# Patient Record
Sex: Female | Born: 1975 | Hispanic: Yes | Marital: Married | Smoking: Never smoker
Health system: Southern US, Community
[De-identification: ages and names within clinical notes are randomized; demographics above are authoritative.]

## PROBLEM LIST (undated history)

## (undated) DIAGNOSIS — L509 Urticaria, unspecified: Secondary | ICD-10-CM

## (undated) HISTORY — DX: Urticaria, unspecified: L50.9

---

## 2016-10-25 ENCOUNTER — Other Ambulatory Visit: Payer: Self-pay | Admitting: Nurse Practitioner

## 2016-10-25 DIAGNOSIS — Z1231 Encounter for screening mammogram for malignant neoplasm of breast: Secondary | ICD-10-CM

## 2016-11-09 ENCOUNTER — Ambulatory Visit
Admission: RE | Admit: 2016-11-09 | Discharge: 2016-11-09 | Disposition: A | Discharge status: Home / Self Care | Payer: No Typology Code available for payment source | Source: Ambulatory Visit | Attending: Nurse Practitioner | Admitting: Nurse Practitioner

## 2016-11-09 DIAGNOSIS — Z1231 Encounter for screening mammogram for malignant neoplasm of breast: Secondary | ICD-10-CM

## 2016-11-10 ENCOUNTER — Other Ambulatory Visit: Payer: Self-pay | Admitting: Nurse Practitioner

## 2016-11-10 DIAGNOSIS — R928 Other abnormal and inconclusive findings on diagnostic imaging of breast: Secondary | ICD-10-CM

## 2016-11-16 ENCOUNTER — Other Ambulatory Visit: Payer: Self-pay | Admitting: Obstetrics and Gynecology

## 2016-11-16 DIAGNOSIS — R928 Other abnormal and inconclusive findings on diagnostic imaging of breast: Secondary | ICD-10-CM

## 2016-12-08 ENCOUNTER — Ambulatory Visit
Admission: RE | Admit: 2016-12-08 | Discharge: 2016-12-08 | Disposition: A | Discharge status: Home / Self Care | Payer: No Typology Code available for payment source | Source: Ambulatory Visit | Attending: Obstetrics and Gynecology | Admitting: Obstetrics and Gynecology

## 2016-12-08 ENCOUNTER — Encounter (HOSPITAL_COMMUNITY): Payer: Self-pay

## 2016-12-08 ENCOUNTER — Ambulatory Visit (HOSPITAL_COMMUNITY)
Admission: RE | Admit: 2016-12-08 | Discharge: 2016-12-08 | Disposition: A | Discharge status: Home / Self Care | Payer: Self-pay | Source: Ambulatory Visit | Attending: Obstetrics and Gynecology | Admitting: Obstetrics and Gynecology

## 2016-12-08 VITALS — BP 135/81 | HR 63 | Temp 98.2°F | Ht 62.0 in | Wt 181.5 lb

## 2016-12-08 DIAGNOSIS — Z1239 Encounter for other screening for malignant neoplasm of breast: Secondary | ICD-10-CM

## 2016-12-08 DIAGNOSIS — R928 Other abnormal and inconclusive findings on diagnostic imaging of breast: Secondary | ICD-10-CM

## 2016-12-08 NOTE — Progress Notes (Signed)
[facility] due to recommending additional imaging of the left breast. Screening mammogram completed 11/09/2016.  Pap Smear: Pap smear not completed today. [facility] and normal per patient. Per patient has no history of an abnormal Pap smear. No Pap smear results are in EPIC.  Physical exam: Breasts Breasts symmetrical. No skin abnormalities bilateral breasts. No nipple retraction bilateral breasts. No nipple discharge bilateral breasts. No lymphadenopathy. No lumps palpated bilateral breasts. No complaints of pain or tenderness on exam. [facility] for a left breast diagnostic mammogram and breast ultrasound. Appointment scheduled for Thursday, December 08, 2016 at 1120.        Pelvic/Bimanuala  No Pap smear completed today since last Pap smear was 05/18/2016 per patient. Pap smear not indicated per BCCCP guidelines.   Smoking History: Patient has never smoked.  Patient Navigation: Patient education provided. [facility].

## 2016-12-08 NOTE — Patient Instructions (Signed)
Explained breast self awareness with Jasmine Burch. Patient did not need a Pap smear today due to last Pap smear was 05/18/2016 per patient. Let her know [facility]CP will cover Pap smears every 3 years unless has a history of abnormal Pap smears. [facility] for a left breast diagnostic mammogram and breast ultrasound. Appointment scheduled for Thursday, December 08, 2016 at 1120. Jasmine LatusMaria Mendez - Burch verbalized understanding.  Marlaine Arey, Kathaleen Maserhristine Poll, RN 1:44 PM

## 2017-02-08 ENCOUNTER — Encounter (HOSPITAL_COMMUNITY): Payer: Self-pay | Admitting: *Deleted

## 2017-02-08 ENCOUNTER — Emergency Department (HOSPITAL_COMMUNITY)
Admission: EM | Admit: 2017-02-08 | Discharge: 2017-02-09 | Disposition: A | Discharge status: Home / Self Care | Payer: Self-pay | Attending: Emergency Medicine | Admitting: Emergency Medicine

## 2017-02-08 ENCOUNTER — Emergency Department (HOSPITAL_COMMUNITY): Payer: Self-pay

## 2017-02-08 DIAGNOSIS — M25422 Effusion, left elbow: Secondary | ICD-10-CM | POA: Insufficient documentation

## 2017-02-08 LAB — CBC WITH DIFFERENTIAL/PLATELET
BASOS ABS: 0 10*3/uL (ref 0.0–0.1)
BASOS PCT: 0 %
EOS ABS: 0.1 10*3/uL (ref 0.0–0.7)
Eosinophils Relative: 1 %
HCT: 40.4 % (ref 36.0–46.0)
HEMOGLOBIN: 13.4 g/dL (ref 12.0–15.0)
Lymphocytes Relative: 30 %
Lymphs Abs: 2.7 10*3/uL (ref 0.7–4.0)
MCH: 30.3 pg (ref 26.0–34.0)
MCHC: 33.2 g/dL (ref 30.0–36.0)
MCV: 91.4 fL (ref 78.0–100.0)
Monocytes Absolute: 0.7 10*3/uL (ref 0.1–1.0)
Monocytes Relative: 7 %
NEUTROS PCT: 62 %
Neutro Abs: 5.6 10*3/uL (ref 1.7–7.7)
Platelets: 247 10*3/uL (ref 150–400)
RBC: 4.42 MIL/uL (ref 3.87–5.11)
RDW: 12.9 % (ref 11.5–15.5)
WBC: 9 10*3/uL (ref 4.0–10.5)

## 2017-02-08 LAB — COMPREHENSIVE METABOLIC PANEL
ALBUMIN: 4 g/dL (ref 3.5–5.0)
ALK PHOS: 60 U/L (ref 38–126)
ALT: 24 U/L (ref 14–54)
ANION GAP: 7 (ref 5–15)
AST: 19 U/L (ref 15–41)
BUN: 9 mg/dL (ref 6–20)
CALCIUM: 9.3 mg/dL (ref 8.9–10.3)
CO2: 25 mmol/L (ref 22–32)
CREATININE: 0.54 mg/dL (ref 0.44–1.00)
Chloride: 104 mmol/L (ref 101–111)
GFR calc Af Amer: >60 mL/min (ref >60)
GFR calc non Af Amer: >60 mL/min (ref >60)
GLUCOSE: 76 mg/dL (ref 65–99)
Potassium: 3.6 mmol/L (ref 3.5–5.1)
SODIUM: 136 mmol/L (ref 135–145)
Total Bilirubin: 0.5 mg/dL (ref 0.3–1.2)
Total Protein: 7.4 g/dL (ref 6.5–8.1)

## 2017-02-08 MED ORDER — VANCOMYCIN HCL IN DEXTROSE 1-5 GM/200ML-% IV SOLN
1000.0000 mg | Freq: Once | INTRAVENOUS | Status: AC
  Administered 2017-02-08: 1000 mg via INTRAVENOUS
  Filled 2017-02-08: qty 200

## 2017-02-08 MED ORDER — HYDROCODONE-ACETAMINOPHEN 5-325 MG PO TABS
2.0000 | ORAL_TABLET | Freq: Once | ORAL | Status: AC
  Administered 2017-02-08: 2 via ORAL
  Filled 2017-02-08: qty 2

## 2017-02-08 NOTE — ED Notes (Signed)
Pt presents to the ED after striking her elbow on a fence gate. Pt went to UC and was referred here to the ED for possible joint infection or blood clot.

## 2017-02-08 NOTE — ED Triage Notes (Signed)
The pt is c/o lt elbow pain for 2 days.  She struck her lt elbow on a gate but no cut was seen  Today she hsw her doctor who did a xray  No fracture was seen but there was a question of cellulitis joint infection or blood clot  The pain has been worse today  With painful movement  lmp last month she has bc

## 2017-02-08 NOTE — ED Provider Notes (Signed)
[facility] Provider Note   CSN: [id number] Arrival date & time: 02/08/17  1618     History   Chief Complaint Chief Complaint  Patient presents with  . Joint Swelling    HPI Latanya PresserMaria Mendez-Damian is a 41 y.o. female.  The history is provided by the patient.  Arm Injury   This is a new problem. The current episode started 2 days ago. The problem occurs constantly. The problem has been gradually worsening. The pain is present in the left elbow. The pain is severe. Associated symptoms include limited range of motion and stiffness. The symptoms are aggravated by activity. She has tried rest for the symptoms. The treatment provided no relief.  pt reports about 2 days ago she hit her left elbow on a gate but denies any falls/trauma or significant injury Today she reports pain/swelling in elbow Seen at urgent care, had xray of left elbow and sent for evaluation for concern for DVT or cellulitis No other acute complaints   PMH - none Soc hx - denies drug use OB History    Gravida Para Term Preterm AB Living   5 4     1 4    SAB TAB Ectopic Multiple Live Births     1     4       Home Medications    Prior to Admission medications   Not on File    Family History No family history on file.  Social History Social History  Substance Use Topics  . Smoking status: Never Smoker  . Smokeless tobacco: Never Used  . Alcohol use No     Allergies   Patient has no known allergies.   Review of Systems Review of Systems  Constitutional: Negative for fever.  Respiratory: Negative for shortness of breath.   Cardiovascular: Negative for chest pain.  Gastrointestinal: Negative for vomiting.  Musculoskeletal: Positive for joint swelling and stiffness.  All other systems reviewed and are negative.    Physical Exam Updated Vital Signs BP 137/87 (BP Location: Right Arm)   Pulse 80   Temp 99.1 F (37.3 C)   Resp 12   Ht 1.575 m (5\' 2" )    Wt 79.4 kg (175 lb)   LMP 01/08/2017   SpO2 100%   BMI 32.01 kg/m   Physical Exam  CONSTITUTIONAL: Well developed/well nourished, anxious HEAD: Normocephalic/atraumatic EYES: EOMI/PERRL ENMT: Mucous membranes moist NECK: supple no meningeal signs SPINE/BACK:entire spine nontender CV: S1/S2 noted, no murmurs/rubs/gallops noted LUNGS: Lungs are clear to auscultation bilaterally, no apparent distress ABDOMEN: soft, nontender, no rebound or guarding, bowel sounds noted throughout abdomen GU:no cva tenderness NEURO: Pt is awake/alert/appropriate, moves all extremitiesx4.  No facial droop.   EXTREMITIES: pulses normal/equal, tenderness/warmth to left elbow.  No crepitus.  No lacerations.  She has limitation in ROM of left elbow.  Tenderness throughout left humerus as well as left forearm.  Distal pulses intact.  She is able to moves fingers/left wrist.   SKIN: warm, color normal PSYCH: no abnormalities of mood noted, alert and oriented to situation     Patient gave verbal permission to utilize photo for medical documentation only The image was not stored on any personal device  ED Treatments / Results  Labs (all labs ordered are listed, but only abnormal results are displayed) Labs Reviewed  CBC WITH DIFFERENTIAL/PLATELET  COMPREHENSIVE METABOLIC PANEL    EKG  EKG Interpretation None       Radiology Dg Elbow Complete Left  Result Date: 02/09/2017 CLINICAL DATA:  Left elbow pain for 2 days. Swelling. Patient reports recent evaluation at urgent care for same. EXAM: LEFT ELBOW - COMPLETE 3+ VIEW COMPARISON:  None available. FINDINGS: There is no evidence of fracture or dislocation. Possible but not definitive joint effusion. There is no evidence of arthropathy or other focal bone abnormality. Soft tissue edema noted posteriorly. No soft tissue air or radiopaque foreign body. IMPRESSION: Possible joint effusion without osseous abnormality. Mild soft tissue edema.  Electronically Signed   By: Rubye Oaks M.D.   On: 02/09/2017 00:20    Procedures .Joint Aspiration/Arthrocentesis Date/Time: 02/09/2017 2:13 AM Performed by: Zadie Rhine Authorized by: Zadie Rhine   Consent:    Consent obtained:  Written   Consent given by:  Patient   Risks discussed:  Bleeding, infection and pain   Alternatives discussed:  No treatment Location:    Location:  Elbow   Elbow:  L elbow Anesthesia (see MAR for exact dosages):    Anesthesia method:  Local infiltration   Local anesthetic:  Lidocaine 1% w/o epi Procedure details:    Preparation: Patient was prepped and draped in usual sterile fashion     Needle gauge:  18 G   Ultrasound guidance: no     Aspirate amount:  Minimal   Aspirate characteristics:  Blood-tinged   Specimen collected: no   Post-procedure details:    Dressing:  Adhesive bandage   Patient tolerance of procedure:  Tolerated well, no immediate complications    Medications Ordered in ED Medications  HYDROcodone-acetaminophen (NORCO/VICODIN) 5-325 MG per tablet 2 tablet (2 tablets Oral Given 02/08/17 2337)  vancomycin (VANCOCIN) IVPB 1000 mg/200 mL premix (0 mg Intravenous Stopped 02/09/17 0053)  ibuprofen (ADVIL,MOTRIN) tablet 800 mg (800 mg Oral Given 02/09/17 0158)  fentaNYL (SUBLIMAZE) injection 50 mcg (50 mcg Intravenous Given 02/09/17 0158)  lidocaine (PF) (XYLOCAINE) 1 % injection 5 mL (5 mLs Other Given 02/09/17 0157)     Initial Impression / Assessment and Plan / ED Course  I have reviewed the triage vital signs and the nursing notes.  Pertinent labs & imaging results that were available during my care of the patient were reviewed by me and considered in my medical decision making (see chart for details). Narcotic database reviewed and considered in decision making     11:42 PM Will get repeat xray, suspect soft tissue infection, low suspicion for DVT 1:42 AM Repeat exam reveals likely joint effusion Will perform  arthrocentesis Discussed with dr swinteck earlier to arrange followup 2:14 AM Unable to extract any significant amt of synovial fluid Pt tolerated well No distress Will place in sling, start Ibuprofen/doxycycline/vicodin with close ortho f/u.  If can't get into ortho will need to be seen in the ED 3:47 AM Pt stable She appears improved Gout vs. Cellulitis vs septic joint, though I feel septic joint unlikely given low risk status She is well appearing Discussed strict ER return precautions and also ortho followup Sling given for comfort BP 132/74   Pulse 78   Temp 99.1 F (37.3 C)   Resp 18   Ht 1.575 m (5\' 2" )   Wt 79.4 kg (175 lb)   LMP 01/08/2017   SpO2 100%   BMI 32.01 kg/m   Final Clinical Impressions(s) / ED Diagnoses   Final diagnoses:  Effusion of left elbow    New Prescriptions New Prescriptions   DOXYCYCLINE (VIBRAMYCIN) 100 MG CAPSULE    Take 1 capsule (100 mg total) by mouth 2 (  two) times daily. One po bid x 7 days   HYDROCODONE-ACETAMINOPHEN (NORCO/VICODIN) 5-325 MG TABLET    Take 1 tablet by mouth every 6 (six) hours as needed for severe pain.   IBUPROFEN (ADVIL,MOTRIN) 600 MG TABLET    Take 1 tablet (600 mg total) by mouth every 8 (eight) hours as needed for moderate pain.     Zadie Rhine, MD 02/09/17 [phone]

## 2017-02-09 MED ORDER — IBUPROFEN 800 MG PO TABS
800.0000 mg | ORAL_TABLET | Freq: Once | ORAL | Status: AC
  Administered 2017-02-09: 800 mg via ORAL
  Filled 2017-02-09: qty 1

## 2017-02-09 MED ORDER — HYDROCODONE-ACETAMINOPHEN 5-325 MG PO TABS: 1.0000 | ORAL_TABLET | Freq: Four times a day (QID) | ORAL | 0 refills | Status: DC | PRN

## 2017-02-09 MED ORDER — IBUPROFEN 600 MG PO TABS: 600.0000 mg | ORAL_TABLET | Freq: Three times a day (TID) | ORAL | 0 refills | Status: AC | PRN

## 2017-02-09 MED ORDER — DOXYCYCLINE HYCLATE 100 MG PO CAPS: 100.0000 mg | ORAL_CAPSULE | Freq: Two times a day (BID) | ORAL | 0 refills | Status: DC

## 2017-02-09 MED ORDER — LIDOCAINE HCL (PF) 1 % IJ SOLN
5.0000 mL | Freq: Once | INTRAMUSCULAR | Status: AC
  Administered 2017-02-09: 5 mL
  Filled 2017-02-09: qty 5

## 2017-02-09 MED ORDER — FENTANYL CITRATE (PF) 100 MCG/2ML IJ SOLN
50.0000 ug | Freq: Once | INTRAMUSCULAR | Status: AC
  Administered 2017-02-09: 50 ug via INTRAVENOUS
  Filled 2017-02-09: qty 2

## 2017-02-09 NOTE — Discharge Instructions (Signed)
IF  YOU ARE UNABLE TO BE SEEN BY ORTHOPEDIC DOCTOR, PLEASE RETURN TO THE ER FOR RECHECK RETURN TO ER EARLIER FOR FEVER OVER A 101, WORSENED PAIN/SWELLING IN THE LEFT ARM OVER THE NEXT 24-48 HOURS

## 2017-11-09 ENCOUNTER — Telehealth (HOSPITAL_COMMUNITY): Payer: Self-pay | Admitting: *Deleted

## 2017-11-09 NOTE — Telephone Encounter (Signed)
Telephoned patient at home number and patient is not eligible for [facility] due to income. Patient voiced understanding.

## 2017-11-13 ENCOUNTER — Other Ambulatory Visit: Payer: Self-pay | Admitting: Nurse Practitioner

## 2017-11-13 ENCOUNTER — Other Ambulatory Visit: Payer: Self-pay | Admitting: Family Medicine

## 2017-11-13 DIAGNOSIS — Z1231 Encounter for screening mammogram for malignant neoplasm of breast: Secondary | ICD-10-CM

## 2017-11-13 DIAGNOSIS — Z30431 Encounter for routine checking of intrauterine contraceptive device: Secondary | ICD-10-CM

## 2017-11-16 ENCOUNTER — Ambulatory Visit
Admission: RE | Admit: 2017-11-16 | Discharge: 2017-11-16 | Disposition: A | Discharge status: Home / Self Care | Payer: Self-pay | Source: Ambulatory Visit | Attending: Nurse Practitioner | Admitting: Nurse Practitioner

## 2017-11-16 DIAGNOSIS — Z30431 Encounter for routine checking of intrauterine contraceptive device: Secondary | ICD-10-CM

## 2017-12-07 ENCOUNTER — Ambulatory Visit
Admission: RE | Admit: 2017-12-07 | Discharge: 2017-12-07 | Disposition: A | Discharge status: Home / Self Care | Payer: No Typology Code available for payment source | Source: Ambulatory Visit | Attending: Nurse Practitioner | Admitting: Nurse Practitioner

## 2017-12-07 DIAGNOSIS — Z1231 Encounter for screening mammogram for malignant neoplasm of breast: Secondary | ICD-10-CM

## 2019-04-12 HISTORY — PX: LIPOSUCTION: SHX10

## 2019-06-18 ENCOUNTER — Other Ambulatory Visit (HOSPITAL_COMMUNITY): Payer: Self-pay | Admitting: Nurse Practitioner

## 2019-06-18 DIAGNOSIS — Z1231 Encounter for screening mammogram for malignant neoplasm of breast: Secondary | ICD-10-CM

## 2019-07-03 ENCOUNTER — Other Ambulatory Visit: Payer: Self-pay

## 2019-07-03 ENCOUNTER — Ambulatory Visit (HOSPITAL_COMMUNITY)
Admission: RE | Admit: 2019-07-03 | Discharge: 2019-07-03 | Disposition: A | Discharge status: Home / Self Care | Payer: Self-pay | Source: Ambulatory Visit | Attending: Nurse Practitioner | Admitting: Nurse Practitioner

## 2019-07-03 DIAGNOSIS — Z1231 Encounter for screening mammogram for malignant neoplasm of breast: Secondary | ICD-10-CM | POA: Insufficient documentation

## 2019-07-05 ENCOUNTER — Other Ambulatory Visit: Payer: Self-pay

## 2019-07-05 ENCOUNTER — Emergency Department (HOSPITAL_BASED_OUTPATIENT_CLINIC_OR_DEPARTMENT_OTHER)
Admission: EM | Admit: 2019-07-05 | Discharge: 2019-07-05 | Disposition: A | Discharge status: Home / Self Care | Payer: No Typology Code available for payment source | Attending: Emergency Medicine | Admitting: Emergency Medicine

## 2019-07-05 ENCOUNTER — Emergency Department (HOSPITAL_BASED_OUTPATIENT_CLINIC_OR_DEPARTMENT_OTHER): Payer: No Typology Code available for payment source

## 2019-07-05 ENCOUNTER — Encounter (HOSPITAL_BASED_OUTPATIENT_CLINIC_OR_DEPARTMENT_OTHER): Payer: Self-pay

## 2019-07-05 DIAGNOSIS — R1032 Left lower quadrant pain: Secondary | ICD-10-CM

## 2019-07-05 DIAGNOSIS — N10 Acute tubulo-interstitial nephritis: Secondary | ICD-10-CM | POA: Insufficient documentation

## 2019-07-05 DIAGNOSIS — N12 Tubulo-interstitial nephritis, not specified as acute or chronic: Secondary | ICD-10-CM

## 2019-07-05 LAB — URINALYSIS, ROUTINE W REFLEX MICROSCOPIC
Bilirubin Urine: NEGATIVE
Glucose, UA: NEGATIVE mg/dL
Ketones, ur: NEGATIVE mg/dL
Leukocytes,Ua: NEGATIVE
Nitrite: NEGATIVE
Protein, ur: NEGATIVE mg/dL
Specific Gravity, Urine: 1.025 (ref 1.005–1.030)
pH: 6 (ref 5.0–8.0)

## 2019-07-05 LAB — CBC WITH DIFFERENTIAL/PLATELET
Abs Immature Granulocytes: 0.01 10*3/uL (ref 0.00–0.07)
Basophils Absolute: 0 10*3/uL (ref 0.0–0.1)
Basophils Relative: 0 %
Eosinophils Absolute: 0.1 10*3/uL (ref 0.0–0.5)
Eosinophils Relative: 1 %
HCT: 43.3 % (ref 36.0–46.0)
Hemoglobin: 13.8 g/dL (ref 12.0–15.0)
Immature Granulocytes: 0 %
Lymphocytes Relative: 37 %
Lymphs Abs: 2.8 10*3/uL (ref 0.7–4.0)
MCH: 29.2 pg (ref 26.0–34.0)
MCHC: 31.9 g/dL (ref 30.0–36.0)
MCV: 91.5 fL (ref 80.0–100.0)
Monocytes Absolute: 0.4 10*3/uL (ref 0.1–1.0)
Monocytes Relative: 5 %
Neutro Abs: 4.3 10*3/uL (ref 1.7–7.7)
Neutrophils Relative %: 57 %
Platelets: 316 10*3/uL (ref 150–400)
RBC: 4.73 MIL/uL (ref 3.87–5.11)
RDW: 12.6 % (ref 11.5–15.5)
WBC: 7.6 10*3/uL (ref 4.0–10.5)
nRBC: 0 % (ref 0.0–0.2)

## 2019-07-05 LAB — BASIC METABOLIC PANEL
Anion gap: 10 (ref 5–15)
BUN: 8 mg/dL (ref 6–20)
CO2: 23 mmol/L (ref 22–32)
Calcium: 9 mg/dL (ref 8.9–10.3)
Chloride: 103 mmol/L (ref 98–111)
Creatinine, Ser: 0.55 mg/dL (ref 0.44–1.00)
GFR calc Af Amer: >60 mL/min (ref >60)
GFR calc non Af Amer: >60 mL/min (ref >60)
Glucose, Bld: 84 mg/dL (ref 70–99)
Potassium: 3.6 mmol/L (ref 3.5–5.1)
Sodium: 136 mmol/L (ref 135–145)

## 2019-07-05 LAB — PREGNANCY, URINE: Preg Test, Ur: NEGATIVE

## 2019-07-05 LAB — URINALYSIS, MICROSCOPIC (REFLEX)

## 2019-07-05 MED ORDER — SODIUM CHLORIDE 0.9 % IV BOLUS
1000.0000 mL | Freq: Once | INTRAVENOUS | Status: AC
  Administered 2019-07-05: 1000 mL via INTRAVENOUS

## 2019-07-05 MED ORDER — SODIUM CHLORIDE 0.9 % IV SOLN
1.0000 g | Freq: Once | INTRAVENOUS | Status: AC
  Administered 2019-07-05 (×2): 1 g via INTRAVENOUS
  Filled 2019-07-05: qty 10

## 2019-07-05 MED ORDER — ONDANSETRON HCL 4 MG/2ML IJ SOLN
4.0000 mg | Freq: Once | INTRAMUSCULAR | Status: AC
  Administered 2019-07-05: 4 mg via INTRAVENOUS
  Filled 2019-07-05: qty 2

## 2019-07-05 MED ORDER — HYDROCODONE-ACETAMINOPHEN 5-325 MG PO TABS: 1.0000 | ORAL_TABLET | ORAL | 0 refills | Status: DC | PRN

## 2019-07-05 MED ORDER — MORPHINE SULFATE (PF) 4 MG/ML IV SOLN
4.0000 mg | Freq: Once | INTRAVENOUS | Status: AC
  Administered 2019-07-05: 4 mg via INTRAVENOUS
  Filled 2019-07-05: qty 1

## 2019-07-05 MED ORDER — KETOROLAC TROMETHAMINE 30 MG/ML IJ SOLN
30.0000 mg | Freq: Once | INTRAMUSCULAR | Status: AC
  Administered 2019-07-05: 30 mg via INTRAVENOUS
  Filled 2019-07-05: qty 1

## 2019-07-05 MED ORDER — CEPHALEXIN 500 MG PO CAPS: 500.0000 mg | ORAL_CAPSULE | Freq: Four times a day (QID) | ORAL | 0 refills | Status: DC

## 2019-07-05 NOTE — ED Provider Notes (Signed)
I assumed care of patient at shift change from previous team, please see their note for full H&P.  Patient is here for left-sided flank pain. Physical Exam  BP (!) 144/90 (BP Location: Left Arm)   Pulse 78   Temp 98.8 F (37.1 C) (Oral)   Resp 16   Ht 5\' 2"  (1.575 m)   Wt 80.3 kg   SpO2 100%   BMI 32.37 kg/m   Physical Exam Vitals and nursing note reviewed.  Constitutional:      General: She is not in acute distress. HENT:     Head: Normocephalic and atraumatic.  Cardiovascular:     Rate and Rhythm: Normal rate.  Pulmonary:     Effort: Pulmonary effort is normal. No respiratory distress.  Neurological:     Mental Status: She is alert. Mental status is at baseline.  Psychiatric:        Mood and Affect: Mood normal.        Behavior: Behavior normal.     ED Course/Procedures     Procedures   CT Renal Stone Study  Result Date: 07/05/2019 CLINICAL DATA:  Lower back pain and left flank pain for 5 days. Hematuria. EXAM: CT ABDOMEN AND PELVIS WITHOUT CONTRAST TECHNIQUE: Multidetector CT imaging of the abdomen and pelvis was performed following the standard protocol without IV contrast. COMPARISON:  Pelvic ultrasound 11/16/2017 FINDINGS: Lower chest: Mild cardiomegaly. Hepatobiliary: Mildly contracted gallbladder. Otherwise unremarkable. Pancreas: Unremarkable Spleen: Unremarkable Adrenals/Urinary Tract: Unremarkable. No urinary tract calculi are identified. No left hydronephrosis or hydroureter. Stomach/Bowel: Unremarkable. Normal appendix. Vascular/Lymphatic: Unremarkable Reproductive: An IUD is satisfactorily positioned in the uterus. Hypodense 3.4 by 3.0 by 3.4 cm left ovarian lesion has an internal density of 12 Hounsfield units, slightly higher than the contents of the urinary bladder. Other: There is subcutaneous bands of edema along the anterior abdominal wall. Foci of fat necrosis or chronic injection related inflammation along the posterolateral buttock regions bilaterally.  Musculoskeletal: Unremarkable IMPRESSION: 1. Hypodense left ovarian lesion measures up to 3.4 cm in long axis. This could be a hemorrhagic cyst or endometrioma. Guidelines in the setting of a probably benign lesion of this sort typically call for follow up pelvic sonography in 6-12 weeks in premenopausal patients. However, this is felt to potentially be the source of the patient's left-sided discomfort, pelvic sonography at this time may be appropriate. 2. Mild cardiomegaly. 3. Subcutaneous bands of edema along the anterior abdominal wall are somewhat unusual, query prior cosmetic surgery such as abdominoplasty. There also small foci of fat necrosis along the lateral buttocks, correlate with any history of injections. In the absence of injections or abdominoplasty, the possibility of some sort of chronic subcutaneous inflammatory process might be considered. Electronically Signed   By: 8-12 M.D.   On: 07/05/2019 16:57   07/07/2019 PELVIC COMPLETE W TRANSVAGINAL AND TORSION R/O  Result Date: 07/05/2019 CLINICAL DATA:  44 year old female with left flank and left lower quadrant pain for 1 week. Left neck is a lesion on CT from earlier today. IUD for 5 years. EXAM: TRANSABDOMINAL AND TRANSVAGINAL ULTRASOUND OF PELVIS DOPPLER ULTRASOUND OF OVARIES TECHNIQUE: Both transabdominal and transvaginal ultrasound examinations of the pelvis were performed. Transabdominal technique was performed for global imaging of the pelvis including uterus, ovaries, adnexal regions, and pelvic cul-de-sac. It was necessary to proceed with endovaginal exam following the transabdominal exam to visualize the endometrium and adnexa. Color and duplex Doppler ultrasound was utilized to evaluate blood flow to the ovaries. COMPARISON:  07/05/2019 unenhanced  CT abdomen/pelvis. 11/16/2017 pelvic sonogram. FINDINGS: Uterus Measurements: 8.4 x 3.9 x 5.3 cm = volume: 90 mL. Anteverted uterus is normal in size and configuration, with no uterine  fibroids or other myometrial abnormalities. Endometrium Thickness: 4 mm. Intrauterine device appears well positioned within the fundal uterine cavity, with no evidence of myometrial penetration. No endometrial cavity fluid or focal endometrial mass demonstrated. Right ovary Measurements: 3.0 x 2.0 x 2.6 cm = volume: 8.4 mL. Simple 1.3 x 1.1 x 1.0 cm right ovarian cyst. Minimally hyperechoic 0.7 cm right ovarian focus, nonspecific, probably a corpora amylacea (no fat seen within the right ovary on the CT study from earlier today). Left ovary Measurements: 3.8 x 3.2 x 3.4 cm = volume: 21.1 mL. Minimally complex 3.4 x 3.1 x 3.0 cm left ovarian cyst with thin eccentric septation. Pulsed Doppler evaluation of both ovaries demonstrates normal low-resistance arterial and venous waveforms. Other findings No abnormal free fluid. IMPRESSION: 1. No evidence of adnexal torsion. 2. Minimally complex 3.4 cm left ovarian cyst with thin eccentric septation. Subcentimeter minimally hyperechoic right ovarian focus. These lesions are probably benign. Suggest follow-up pelvic ultrasound in 2-3 months. 3. Well-positioned IUD in the fundal uterine cavity.  Normal uterus. Electronically Signed   By: Ilona Sorrel M.D.   On: 07/05/2019 19:05     Labs Reviewed  URINALYSIS, ROUTINE W REFLEX MICROSCOPIC - Abnormal; Notable for the following components:      Result Value   APPearance CLOUDY (*)    Hgb urine dipstick MODERATE (*)    All other components within normal limits  URINALYSIS, MICROSCOPIC (REFLEX) - Abnormal; Notable for the following components:   Bacteria, UA MANY (*)    All other components within normal limits  URINE CULTURE  PREGNANCY, URINE  CBC WITH DIFFERENTIAL/PLATELET  BASIC METABOLIC PANEL     MDM  Urine shows concern for UTI, she is getting IV antibiotics and prescription for p.o. antibiotics have been sent. CT scan has already resulted, plan is to follow-up on pelvic ultrasound.  Pelvic ultrasound  results reviewed, and discussed with patient.  We discussed the importance of follow-up, and these instructions were copied and pasted onto her discharge papers.  She has an OB/GYN whom she can follow-up with. Return precautions were discussed with patient who states their understanding.  At the time of discharge patient denied any unaddressed complaints or concerns.  Patient is agreeable for discharge home.  Note: Portions of this report may have been transcribed using voice recognition software. Every effort was made to ensure accuracy; however, inadvertent computerized transcription errors may be present        Ollen Gross 07/05/19 2328    Fredia Sorrow, MD 07/17/19 [phone]

## 2019-07-05 NOTE — Discharge Instructions (Addendum)
Take the antibiotics as prescribed. I have prescribed a short course of pain medication. Only take as needed.  This medication may become addictive.  Do not drive or operate heavy machinery taking this.  Follow-up with your primary care provider  Return for any worsening symptoms  IMPRESSION:  1. No evidence of adnexal torsion.  2. Minimally complex 3.4 cm left ovarian cyst with thin eccentric  septation. Subcentimeter minimally hyperechoic right ovarian focus.  These lesions are probably benign. Suggest follow-up pelvic  ultrasound in 2-3 months.  3. Well-positioned IUD in the fundal uterine cavity.  Normal uterus.

## 2019-07-05 NOTE — ED Provider Notes (Signed)
[facility] EMERGENCY DEPARTMENT Provider Note   CSN: [id number] Arrival date & time: 07/05/19  1505    History Chief Complaint  Patient presents with  . Flank Pain    Jasmine Burch is a 44 y.o. female with past medical history significant for IUD who presents for evaluation of flank pain. Patient has had left flank pain x5 days. Associated dysuria. Was seen by urgent care and sent her to the ED to rule out kidney stone.  Patient states she has had left-sided flank pain x1 week.  Intermittent in nature.  Described as throbbing and aching.  Will occasionally radiate however none currently.  Took a Norco last night which was leftover from her prior elbow surgery.  This helped with her pain.  She rates her current pain a 6/10.  Denies fever, chills, chest pain, shortness of breath, nausea, vomiting, abdominal pain, diarrhea, constipation, hematuria, pelvic pain, vaginal discharge.  Denies additional aggravating or alleviating factors.  Has IUD in place.  No concerns for STDs.  History obtained from patient and past medical records.  No interpretor was used.  HPI     History reviewed. No pertinent past medical history.  There are no problems to display for this patient.   History reviewed. No pertinent surgical history.   OB History    Gravida  5   Para  4   Term      Preterm      AB  1   Living  4     SAB      TAB  1   Ectopic      Multiple      Live Births  4           No family history on file.  Social History   Tobacco Use  . Smoking status: Never Smoker  . Smokeless tobacco: Never Used  Substance Use Topics  . Alcohol use: No  . Drug use: No    Home Medications Prior to Admission medications   Medication Sig Start Date End Date Taking? Authorizing Provider  cephALEXin (KEFLEX) 500 MG capsule Take 1 capsule (500 mg total) by mouth 4 (four) times daily. 07/05/19   Keandre Linden A, PA-C  doxycycline (VIBRAMYCIN) 100 MG capsule  Take 1 capsule (100 mg total) by mouth 2 (two) times daily. One po bid x 7 days 02/09/17   Zadie Rhine, MD  HYDROcodone-acetaminophen (NORCO/VICODIN) 5-325 MG tablet Take 1 tablet by mouth every 4 (four) hours as needed. 07/05/19   Crystian Frith A, PA-C  ibuprofen (ADVIL,MOTRIN) 600 MG tablet Take 1 tablet (600 mg total) by mouth every 8 (eight) hours as needed for moderate pain. 02/09/17   Zadie Rhine, MD    Allergies    Patient has no known allergies.  Review of Systems   Review of Systems  Constitutional: Negative.   HENT: Negative.   Respiratory: Negative.   Cardiovascular: Negative.   Gastrointestinal: Negative.   Genitourinary: Positive for dysuria and flank pain. Negative for decreased urine volume, difficulty urinating, frequency, genital sores, hematuria, menstrual problem, pelvic pain, urgency, vaginal bleeding, vaginal discharge and vaginal pain.  Skin: Negative.   Neurological: Negative.   All other systems reviewed and are negative.   Physical Exam Updated Vital Signs BP 124/81 (BP Location: Left Arm)   Pulse 72   Temp 98.8 F (37.1 C) (Oral)   Resp 16   Ht 5\' 2"  (1.575 m)   Wt 80.3 kg   SpO2 100%  BMI 32.37 kg/m   Physical Exam Vitals and nursing note reviewed.  Constitutional:      General: She is not in acute distress.    Appearance: She is well-developed. She is not ill-appearing, toxic-appearing or diaphoretic.  HENT:     Head: Atraumatic.     Nose: Nose normal.     Mouth/Throat:     Mouth: Mucous membranes are moist.  Eyes:     Pupils: Pupils are equal, round, and reactive to light.  Cardiovascular:     Rate and Rhythm: Normal rate.     Pulses: Normal pulses.     Heart sounds: Normal heart sounds.  Pulmonary:     Effort: Pulmonary effort is normal. No respiratory distress.     Breath sounds: Normal breath sounds.  Abdominal:     General: Bowel sounds are normal. There is no distension.     Tenderness: There is no abdominal  tenderness. There is left CVA tenderness. There is no right CVA tenderness, guarding or rebound. Negative signs include Murphy's sign and McBurney's sign.     Hernia: No hernia is present.     Comments: Minimal tenderness to left CVA however negative tap.  Musculoskeletal:        General: No swelling, tenderness, deformity or signs of injury. Normal range of motion.     Cervical back: Normal range of motion.     Right lower leg: No edema.     Left lower leg: No edema.  Skin:    General: Skin is warm and dry.     Capillary Refill: Capillary refill takes less than 2 seconds.     Comments: Brisk cap refill. No overlying skin changes  Neurological:     General: No focal deficit present.     Mental Status: She is alert and oriented to person, place, and time.     Sensory: Sensation is intact.     Motor: Motor function is intact.     Coordination: Coordination is intact.     Gait: Gait is intact.     ED Results / Procedures / Treatments   Labs (all labs ordered are listed, but only abnormal results are displayed) Labs Reviewed  URINALYSIS, ROUTINE W REFLEX MICROSCOPIC - Abnormal; Notable for the following components:      Result Value   APPearance CLOUDY (*)    Hgb urine dipstick MODERATE (*)    All other components within normal limits  URINALYSIS, MICROSCOPIC (REFLEX) - Abnormal; Notable for the following components:   Bacteria, UA MANY (*)    All other components within normal limits  URINE CULTURE  PREGNANCY, URINE  CBC WITH DIFFERENTIAL/PLATELET  BASIC METABOLIC PANEL    EKG None  Radiology CT Renal Stone Study  Result Date: 07/05/2019 CLINICAL DATA:  Lower back pain and left flank pain for 5 days. Hematuria. EXAM: CT ABDOMEN AND PELVIS WITHOUT CONTRAST TECHNIQUE: Multidetector CT imaging of the abdomen and pelvis was performed following the standard protocol without IV contrast. COMPARISON:  Pelvic ultrasound 11/16/2017 FINDINGS: Lower chest: Mild cardiomegaly.  Hepatobiliary: Mildly contracted gallbladder. Otherwise unremarkable. Pancreas: Unremarkable Spleen: Unremarkable Adrenals/Urinary Tract: Unremarkable. No urinary tract calculi are identified. No left hydronephrosis or hydroureter. Stomach/Bowel: Unremarkable. Normal appendix. Vascular/Lymphatic: Unremarkable Reproductive: An IUD is satisfactorily positioned in the uterus. Hypodense 3.4 by 3.0 by 3.4 cm left ovarian lesion has an internal density of 12 Hounsfield units, slightly higher than the contents of the urinary bladder. Other: There is subcutaneous bands of edema along the anterior abdominal  wall. Foci of fat necrosis or chronic injection related inflammation along the posterolateral buttock regions bilaterally. Musculoskeletal: Unremarkable IMPRESSION: 1. Hypodense left ovarian lesion measures up to 3.4 cm in long axis. This could be a hemorrhagic cyst or endometrioma. Guidelines in the setting of a probably benign lesion of this sort typically call for follow up pelvic sonography in 6-12 weeks in premenopausal patients. However, this is felt to potentially be the source of the patient's left-sided discomfort, pelvic sonography at this time may be appropriate. 2. Mild cardiomegaly. 3. Subcutaneous bands of edema along the anterior abdominal wall are somewhat unusual, query prior cosmetic surgery such as abdominoplasty. There also small foci of fat necrosis along the lateral buttocks, correlate with any history of injections. In the absence of injections or abdominoplasty, the possibility of some sort of chronic subcutaneous inflammatory process might be considered. Electronically Signed   By: Gaylyn Rong M.D.   On: 07/05/2019 16:57    Procedures Procedures (including critical care time)  Medications Ordered in ED Medications  ketorolac (TORADOL) 30 MG/ML injection 30 mg (has no administration in time range)  sodium chloride 0.9 % bolus 1,000 mL (1,000 mLs Intravenous New Bag/Given 07/05/19  1705)  ondansetron (ZOFRAN) injection 4 mg (4 mg Intravenous Given 07/05/19 1706)  morphine 4 MG/ML injection 4 mg (4 mg Intravenous Given 07/05/19 1706)  cefTRIAXone (ROCEPHIN) 1 g in sodium chloride 0.9 % 100 mL IVPB (1 g Intravenous Other (enter comment in med admin window) 07/05/19 1717)   ED Course  I have reviewed the triage vital signs and the nursing notes.  Pertinent labs & imaging results that were available during my care of the patient were reviewed by me and considered in my medical decision making (see chart for details).  44 year old appears otherwise well presents for evaluation of flank pain x1 week. She is afebrile, nonseptic, not ill-appearing.  Abdomen soft, nontender.  Mild left CVA tenderness. No overlying skin changes.  Heart and lungs clear.  Plan on labs, imaging reassess  Labs and imaging personally reviewed interpreted: Urinalysis with moderate blood, many bacteria.  Will culture.  Given she is having symptoms we will treat with Keflex Pregnancy test negative CBC without leukocytosis  Metabolic panel without acute abnormality. CT stone study without any evidence of stone.  She does have hypodense ovarian lesion. Possibly cyst. Recommend ultrasound.  Discussed with patient. She is agreeable to ultrasound. They also discuss possible anterior abdominal wall in gluteus fat necrosis on her scan. Patient does have prior history of liposuction to her abdomen and gluteus. This is likely what is seen on CT scan.  She has no overlying skin changes. No edema, erythema or warmth. Low suspicion for active infectious process  Discussed CT findings with patient.  She is agreeable to ultrasound.  We will give her some IV pain medicine, IV fluids and reassess.  Normal pelvic exam at this time.  Has no concerns for STDs, vaginal discharge.  I feels is reasonable at this time.  She is close follow-up with PCP.  Care transfered to [facility] [place] who will follow up on remaining imaging and  determine ultimate disposition. Likely anticipate dc home with abx for presumed pyelonephritis.     MDM Rules/Calculators/A&P                       Final Clinical Impression(s) / ED Diagnoses Final diagnoses:  LLQ pain  Pyelonephritis    Rx / DC Orders ED Discharge Orders  Ordered    cephALEXin (KEFLEX) 500 MG capsule  4 times daily     07/05/19 1711    HYDROcodone-acetaminophen (NORCO/VICODIN) 5-325 MG tablet  Every 4 hours PRN     07/05/19 1712           Ledarius Leeson A, PA-C 07/05/19 1738    Fredia Sorrow, MD 07/17/19 [phone]

## 2019-07-05 NOTE — ED Triage Notes (Signed)
Pt c/o left flank, dysuria x 5 days-sent from UC to r/o kidney stone-NAD-steady gait

## 2019-07-06 LAB — URINE CULTURE: Culture: NO GROWTH

## 2019-08-14 ENCOUNTER — Other Ambulatory Visit (HOSPITAL_COMMUNITY): Payer: Self-pay | Admitting: Nurse Practitioner

## 2019-08-14 DIAGNOSIS — N6320 Unspecified lump in the left breast, unspecified quadrant: Secondary | ICD-10-CM

## 2019-08-27 ENCOUNTER — Ambulatory Visit (HOSPITAL_COMMUNITY): Payer: Self-pay

## 2019-08-27 ENCOUNTER — Encounter (HOSPITAL_COMMUNITY): Payer: No Typology Code available for payment source

## 2019-09-03 ENCOUNTER — Encounter (HOSPITAL_COMMUNITY): Payer: No Typology Code available for payment source

## 2019-09-03 ENCOUNTER — Encounter (HOSPITAL_COMMUNITY): Payer: Self-pay

## 2019-09-03 ENCOUNTER — Ambulatory Visit (HOSPITAL_COMMUNITY): Payer: Self-pay

## 2019-09-17 ENCOUNTER — Other Ambulatory Visit: Payer: Self-pay

## 2019-09-17 ENCOUNTER — Other Ambulatory Visit (HOSPITAL_COMMUNITY): Payer: No Typology Code available for payment source

## 2019-09-17 ENCOUNTER — Ambulatory Visit (HOSPITAL_COMMUNITY)
Admission: RE | Admit: 2019-09-17 | Discharge: 2019-09-17 | Disposition: A | Discharge status: Home / Self Care | Payer: Self-pay | Source: Ambulatory Visit | Attending: Nurse Practitioner | Admitting: Nurse Practitioner

## 2019-09-17 DIAGNOSIS — N6320 Unspecified lump in the left breast, unspecified quadrant: Secondary | ICD-10-CM | POA: Insufficient documentation

## 2021-01-11 IMAGING — MG MM DIGITAL DIAGNOSTIC UNILAT*L* W/ TOMO W/ CAD
4 series · 4 of 12 positions shown · non-contrast
Comparison: Previous exams.

CLINICAL DATA: Screening recall for possible left breast mass.

EXAM:
DIGITAL DIAGNOSTIC UNILATERAL LEFT MAMMOGRAM WITH CAD AND TOMO
LEFT BREAST ULTRASOUND

[L CC synth-2D]
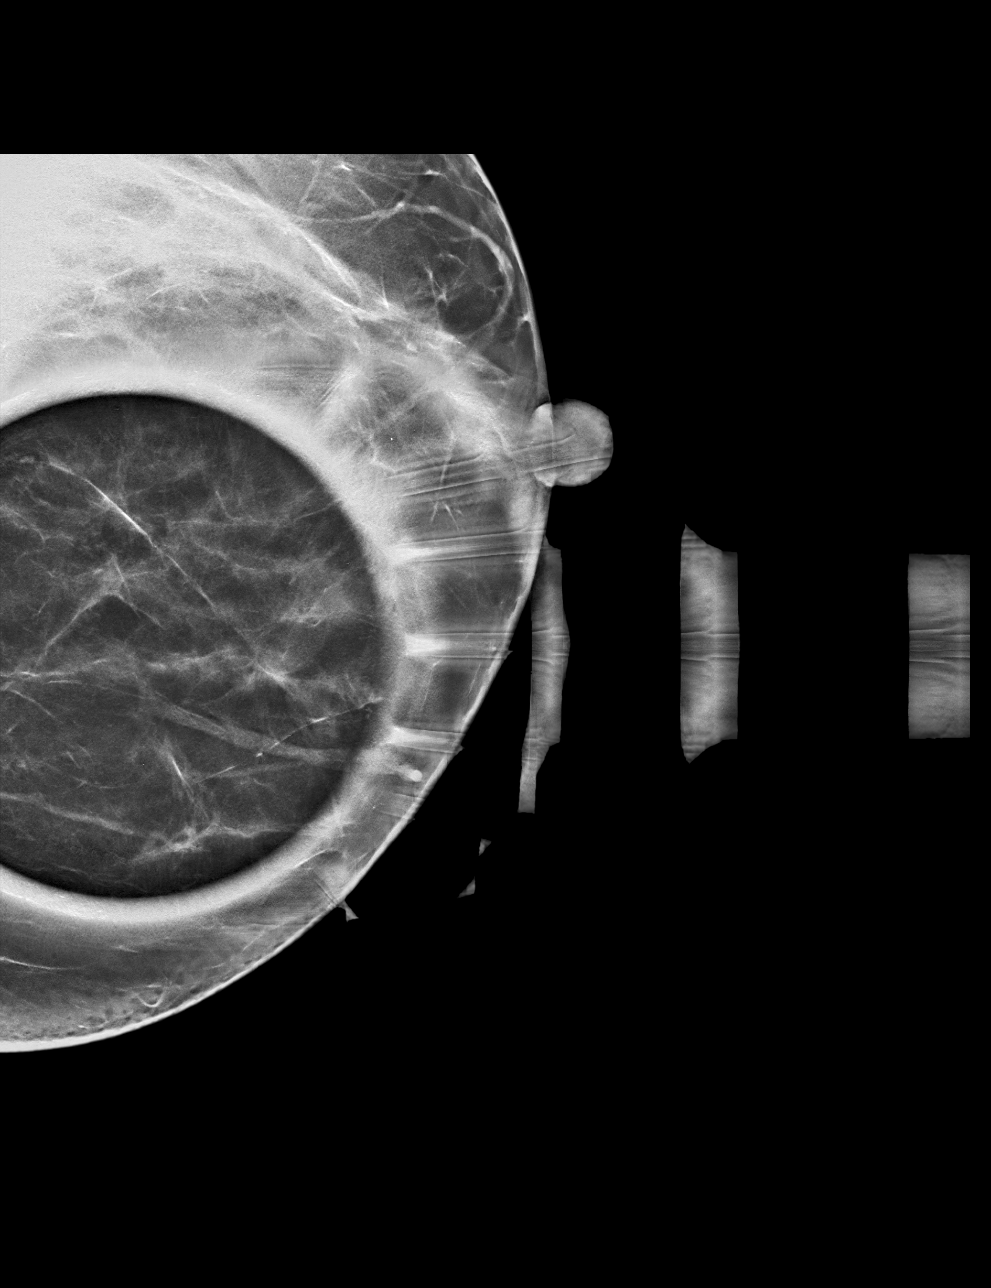

[L MLO synth-2D]
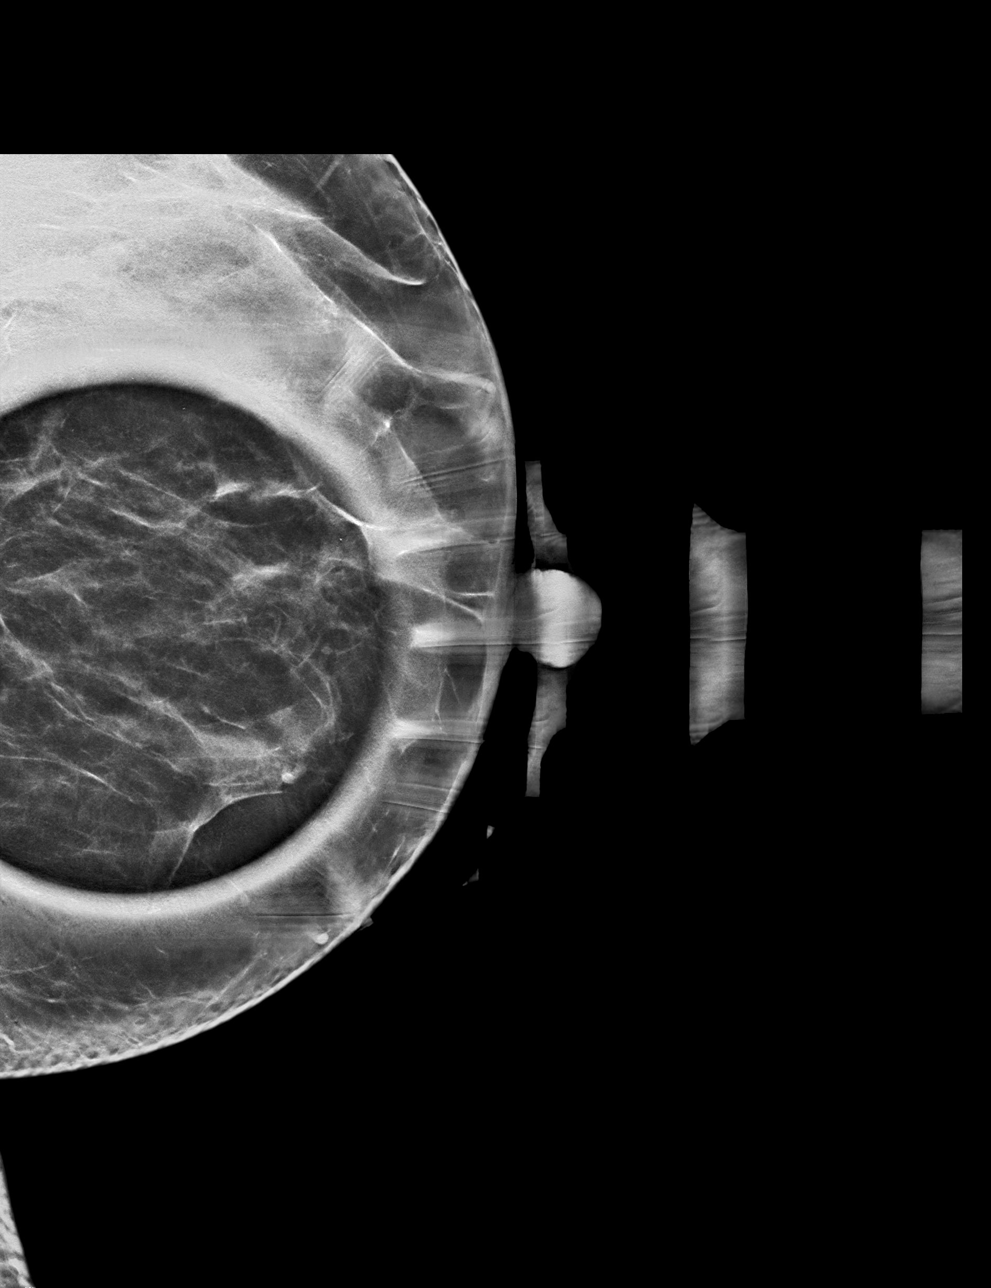

[L MLO tomo · tomo slice 33/64.0]
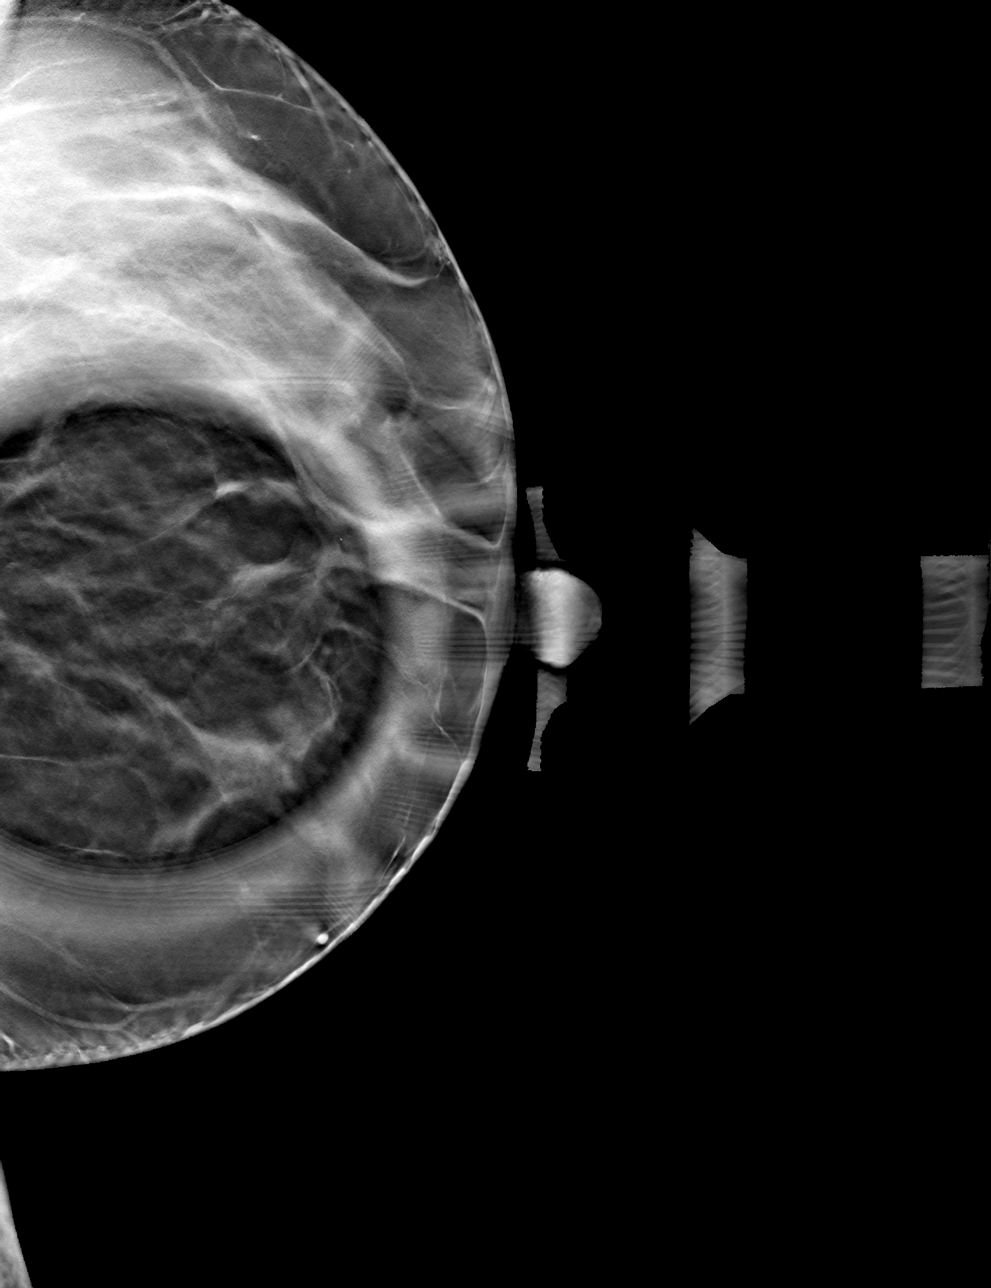

[L CC tomo · tomo slice 29/57.0]
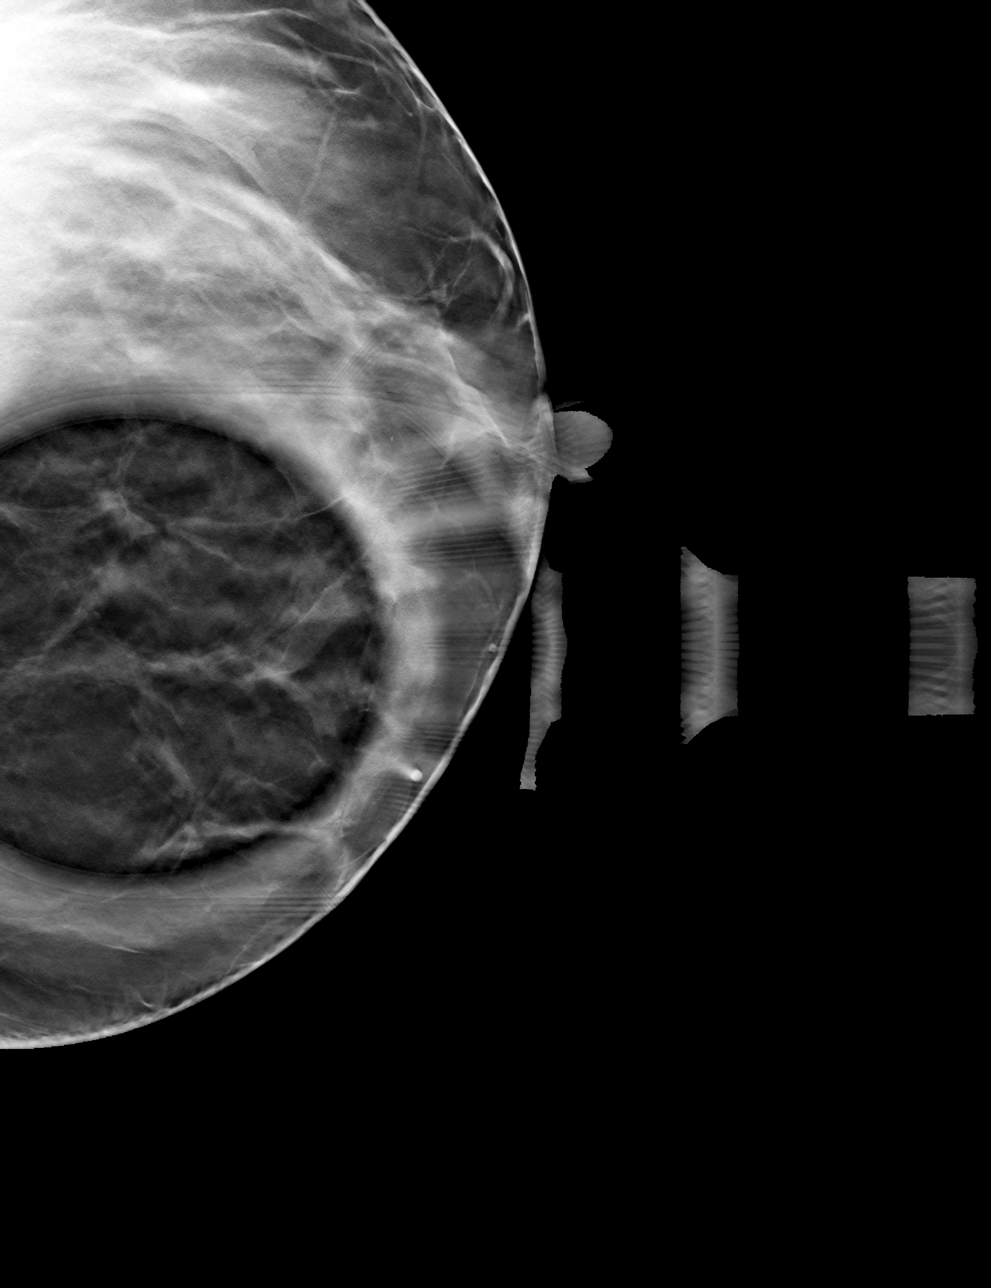

[4 of 12 positions shown; findings below may reference images not displayed]

ACR Breast Density Category c: The breast tissue is heterogeneously
dense, which may obscure small masses.
FINDINGS: Spot compression tomograms were performed over the slightly inner
left breast. The initially questioned possible left breast mass
resolves on the additional imaging with only dense fibroglandular
tissue seen in this location.

Mammographic images were processed with CAD.

Targeted ultrasound of the central to inner left breast was
performed. Multiple dilated fluid-filled ducts are identified in the
retroareolar left breast, with a dilated fluid-filled duct at the 9
o'clock position 1 cm from the nipple likely corresponding with
initial mammography findings. No intraductal masses or other
abnormalities seen in the central to slightly inner left breast.
IMPRESSION: No findings of malignancy in the left breast.

RECOMMENDATION:
Screening mammogram in one year.(Code:06-9-8K3)

I have discussed the findings and recommendations with the patient.
If applicable, a reminder letter will be sent to the patient
regarding the next appointment.

BI-RADS CATEGORY  2: Benign.

## 2021-12-06 ENCOUNTER — Other Ambulatory Visit: Payer: Self-pay | Admitting: Nurse Practitioner

## 2021-12-06 DIAGNOSIS — Z1231 Encounter for screening mammogram for malignant neoplasm of breast: Secondary | ICD-10-CM

## 2021-12-23 ENCOUNTER — Ambulatory Visit
Admission: RE | Admit: 2021-12-23 | Discharge: 2021-12-23 | Disposition: A | Discharge status: Home / Self Care | Payer: Self-pay | Source: Ambulatory Visit | Attending: Nurse Practitioner | Admitting: Nurse Practitioner

## 2021-12-23 DIAGNOSIS — Z1231 Encounter for screening mammogram for malignant neoplasm of breast: Secondary | ICD-10-CM

## 2023-11-21 ENCOUNTER — Encounter: Payer: Self-pay | Admitting: Allergy

## 2023-11-21 ENCOUNTER — Ambulatory Visit (INDEPENDENT_AMBULATORY_CARE_PROVIDER_SITE_OTHER): Payer: Self-pay | Admitting: Allergy

## 2023-11-21 VITALS — BP 130/84 | HR 71 | Temp 98.1°F | Resp 16 | Ht 62.6 in | Wt 179.2 lb

## 2023-11-21 DIAGNOSIS — R030 Elevated blood-pressure reading, without diagnosis of hypertension: Secondary | ICD-10-CM

## 2023-11-21 DIAGNOSIS — L509 Urticaria, unspecified: Secondary | ICD-10-CM

## 2023-11-21 NOTE — Patient Instructions (Addendum)
 Hives: Start zyrtec (cetirizine) 10mg  OR allegra (fexofenadine) 180mg  once a day - may take twice a day if symptoms not controlled.  If symptoms are not controlled or causes drowsiness let us  know. Start Pepcid (famotidine) 20mg  once a day and may take twice a day if symptoms not controlled.  Avoid the following potential triggers: alcohol, tight clothing, NSAIDs, hot showers and getting overheated. See below for proper skin care.   Get bloodwork if no improvement after taking the medications.  We are ordering labs, so please allow 1-2 weeks for the results to come back. With the newly implemented Cures Act, the labs might be visible to you at the same time that they become visible to me. However, I will not address the results until all of the results are back, so please be patient.   Elevated blood pressure  Blood pressure reading was high in our office today. Vitals:   11/21/23 1007  BP: (!) 134/94   Please follow up with PCP regarding this.    Follow up in 6 weeks or sooner if needed.    Skin care recommendations  Bath time: Always use lukewarm water. AVOID very hot or cold water. Keep bathing time to 5-10 minutes. Do NOT use bubble bath. Use a mild soap and use just enough to wash the dirty areas. Do NOT scrub skin vigorously.  After bathing, pat dry your skin with a towel. Do NOT rub or scrub the skin.  Moisturizers and prescriptions:  ALWAYS apply moisturizers immediately after bathing (within 3 minutes). This helps to lock-in moisture. Use the moisturizer several times a day over the whole body. Good summer moisturizers include: Aveeno, CeraVe, Cetaphil. Good winter moisturizers include: Aquaphor, Vaseline, Cerave, Cetaphil, Eucerin, Vanicream. When using moisturizers along with medications, the moisturizer should be applied about one hour after applying the medication to prevent diluting effect of the medication or moisturize around where you applied the medications.  When not using medications, the moisturizer can be continued twice daily as maintenance.  Laundry and clothing: Avoid laundry products with added color or perfumes. Use unscented hypo-allergenic laundry products such as Tide free, Cheer free & gentle, and All free and clear.  If the skin still seems dry or sensitive, you can try double-rinsing the clothes. Avoid tight or scratchy clothing such as wool. Do not use fabric softeners or dyer sheets.

## 2023-11-21 NOTE — Progress Notes (Signed)
 New Patient Note  RE: Jasmine Burch MRN: [id number] DOB: 06/27/1975 Date of Office Visit: 11/21/2023  Consult requested by: RAYNALDO JERELYN [place]K* Primary care provider: [facility]  Chief Complaint: Rash, Urticaria, and Pruritus  History of Present Illness: I had the pleasure of seeing Jasmine Burch for initial evaluation at the [facility] on 11/21/2023. She is a 48 y.o. female, who is self-referred here for the evaluation of rash.  Discussed the use of AI scribe software for clinical note transcription with the patient, who gave verbal consent to proceed.    She has been experiencing widespread rashes for the past two months, characterized by itchy hives that typically last less than an hour before subsiding. These episodes occur a couple of times a week, with recent occurrences on a Friday and the preceding Wednesday.  She has attempted to identify potential triggers by tracking her food intake but has not identified any specific foods that consistently cause the rashes. She mentions consuming chicken and shrimp without any consistent correlation to the rash outbreaks. No recent illness or environmental changes have been noted that could explain the onset of symptoms.  For symptom relief, she has been taking Benadryl, which provides relief but causes drowsiness. She has not been prescribed steroids or any other medications for this condition and has not sought medical attention for this issue prior to this visit.  In her review of symptoms, she reports dry eyes and some skin dryness but denies fever, chills, or other systemic symptoms. She has no known medication or food allergies, although she experiences mild seasonal allergies in the spring.  Her past medical history is unremarkable, and she is not currently taking any medications other than regular vitamins. She has two dogs, one of which is an indoor pet for the past three years.      Rash started about 2 months ago. This can occur anywhere on her body. Describes them as itchy, red, raised. Individual rashes lasts about less than 1 hour. No ecchymosis upon resolution. Associated symptoms include: none.  Frequency of episodes: a few times per week. Suspected triggers are unknown. Denies any fevers, chills, changes in medications, foods, personal care products or recent infections. She has tried the following therapies: benadryl with some benefit. Systemic steroids: no.  Previous work up includes: none. Previous history of rash/hives: no. Patient is up to date with the following cancer screening tests: physical exam, mammogram.   Pictures reviewed - consistent with urticarial rash.   Assessment and Plan: Jasmine Burch is a 47 y.o. female with: Urticaria Breaking out at least twice per week for the past 2 months with no known triggers. Resolves within 1 hour. Taking Benadryl but it makes her drowsy. No recent labs.  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 Discussed with patient, that urticaria is usually caused by release of histamine by cutaneous mast cells but sometimes it is non-histamine mediated. Explained that urticaria is not always associated with allergies. In most cases, the exact etiology for urticaria can not be established and it is considered idiopathic. Start zyrtec (cetirizine) 10mg  OR allegra (fexofenadine) 180mg  once a day - may take twice a day if symptoms not controlled.  If symptoms are not controlled or causes drowsiness let us  know. Start Pepcid (famotidine) 20mg  once a day and may take twice a day if symptoms not controlled.  Avoid the following potential triggers: alcohol, tight clothing, NSAIDs, hot showers and getting overheated. See below for  proper skin care.  Get bloodwork if no improvement after taking the medications.  No indication for any skin testing at this time as I don't think this is triggered by any  environmental or food allergies.   Elevated blood pressure reading Please follow up with PCP regarding this.    Return in about 6 weeks (around 01/02/2024).  No orders of the defined types were placed in this encounter.  Lab Orders         Chronic Urticaria ([facility]) Eval         ANA, IFA (with reflex)         Alpha-Gal Panel         Comprehensive metabolic panel with GFR         Tryptase      Other allergy screening: Asthma: no Rhino conjunctivitis:  Some rhinorrhea in the spring.  No prior testing.  Food allergy: no Medication allergy: no Hymenoptera allergy: no Eczema:no History of recurrent infections suggestive of immunodeficency: no  Diagnostics: None.   Past Medical History: There are no active problems to display for this patient.  Past Medical History:  Diagnosis Date   Urticaria    Past Surgical History: Past Surgical History:  Procedure Laterality Date   LIPOSUCTION  2021   Medication List:  Current Outpatient Medications  Medication Sig Dispense Refill   ibuprofen  (ADVIL ,MOTRIN ) 600 MG tablet Take 1 tablet (600 mg total) by mouth every 8 (eight) hours as needed for moderate pain. 21 tablet 0   No current facility-administered medications for this visit.   Allergies: No Known Allergies Social History: Social History   Socioeconomic History   Marital status: Married    Spouse name: Not on file   Number of children: Not on file   Years of education: Not on file   Highest education level: Not on file  Occupational History   Not on file  Tobacco Use   Smoking status: Never   Smokeless tobacco: Never  Vaping Use   Vaping status: Never Used  Substance and Sexual Activity   Alcohol use: No   Drug use: No   Sexual activity: Not on file  Other Topics Concern   Not on file  Social History Narrative   Not on file   Social Drivers of Health   Financial Resource Strain: Not on file  Food Insecurity: Not on file  Transportation Needs: Not on  file  Physical Activity: Not on file  Stress: Not on file  Social Connections: Not on file   Lives in a house. Smoking: denies Occupation: stay at home, self-employed  Environmental History: Immunologist in the house: no Engineer, civil (consulting) in the family room: no Carpet in the bedroom: yes Heating: heat pump Cooling: central Pet: yes 1 dogindoors x 3 yrs, 1 dog outdoors  Family History: Family History  Problem Relation Age of Onset   Asthma Sister    Eczema Daughter    Asthma Nephew    Breast cancer Neg Hx    Review of Systems  Constitutional:  Negative for appetite change, chills, fever and unexpected weight change.  HENT:  Negative for congestion and rhinorrhea.   Eyes:  Negative for itching.       Dry eyes  Respiratory:  Negative for cough, chest tightness, shortness of breath and wheezing.   Cardiovascular:  Negative for chest pain.  Gastrointestinal:  Negative for abdominal pain.  Genitourinary:  Negative for difficulty urinating.  Skin:  Positive for rash.  Neurological:  Negative for  headaches.    Objective: BP 130/84   Pulse 71   Temp 98.1 F (36.7 C)   Resp 16   Ht 5' 2.6 (1.59 m)   Wt 179 lb 4 oz (81.3 kg)   SpO2 97%   BMI 32.16 kg/m  Body mass index is 32.16 kg/m. Physical Exam Vitals and nursing note reviewed.  Constitutional:      Appearance: Normal appearance. She is well-developed.  HENT:     Head: Normocephalic and atraumatic.     Right Ear: Tympanic membrane and external ear normal.     Left Ear: Tympanic membrane and external ear normal.     Nose: Nose normal.     Mouth/Throat:     Mouth: Mucous membranes are moist.     Pharynx: Oropharynx is clear.  Eyes:     Conjunctiva/sclera: Conjunctivae normal.  Cardiovascular:     Rate and Rhythm: Normal rate and regular rhythm.     Heart sounds: Normal heart sounds. No murmur heard.    No friction rub. No gallop.  Pulmonary:     Effort: Pulmonary effort is normal.     Breath sounds: Normal  breath sounds. No wheezing, rhonchi or rales.  Musculoskeletal:     Cervical back: Neck supple.  Skin:    General: Skin is warm.     Findings: No rash.  Neurological:     Mental Status: She is alert and oriented to person, place, and time.  Psychiatric:        Behavior: Behavior normal.    The plan was reviewed with the patient/family, and all questions/concerned were addressed.  It was my pleasure to see Jasmine Burch today and participate in her care. Please feel free to contact me with any questions or concerns.  Sincerely,  Orlan Cramp, DO Allergy & Immunology  [facility]  [place] office: [phone] [facility] [place] office: [phone]

## 2023-12-12 ENCOUNTER — Other Ambulatory Visit: Payer: Self-pay | Admitting: Nurse Practitioner

## 2023-12-12 DIAGNOSIS — Z1231 Encounter for screening mammogram for malignant neoplasm of breast: Secondary | ICD-10-CM

## 2023-12-26 ENCOUNTER — Ambulatory Visit
Admission: RE | Admit: 2023-12-26 | Discharge: 2023-12-26 | Disposition: A | Discharge status: Home / Self Care | Payer: Self-pay | Source: Ambulatory Visit | Attending: Nurse Practitioner | Admitting: Nurse Practitioner

## 2023-12-26 DIAGNOSIS — Z1231 Encounter for screening mammogram for malignant neoplasm of breast: Secondary | ICD-10-CM

## 2023-12-31 LAB — COMPREHENSIVE METABOLIC PANEL WITH GFR
AST: 15 IU/L (ref 0–40)
Albumin: 4.6 g/dL (ref 3.9–4.9)
Alkaline Phosphatase: 72 IU/L (ref 44–121)
BUN/Creatinine Ratio: 13 (ref 9–23)
BUN: 8 mg/dL (ref 6–24)
Bilirubin Total: 0.5 mg/dL (ref 0.0–1.2)
CO2: 20 mmol/L (ref 20–29)
Calcium: 9.2 mg/dL (ref 8.7–10.2)
Chloride: 105 mmol/L (ref 96–106)
Creatinine, Ser: 0.62 mg/dL (ref 0.57–1.00)
Globulin, Total: 2.5 g/dL (ref 1.5–4.5)
Glucose: 85 mg/dL (ref 70–99)
Potassium: 3.9 mmol/L (ref 3.5–5.2)
Sodium: 140 mmol/L (ref 134–144)
Total Protein: 7.1 g/dL (ref 6.0–8.5)
eGFR: 110 mL/min/1.73 (ref >59)

## 2023-12-31 LAB — ANTINUCLEAR ANTIBODIES, IFA: ANA Titer 1: POSITIVE — AB

## 2023-12-31 LAB — CHRONIC URTICARIA (CU) EVAL
ALT: 17 IU/L (ref 0–32)
Basophils Absolute: 0 x10E3/uL (ref 0.0–0.2)
Basos: 0 %
CRP: 4 mg/L (ref 0–10)
EOS (ABSOLUTE): 0.1 x10E3/uL (ref 0.0–0.4)
Eos: 1 %
Hematocrit: 43.7 % (ref 34.0–46.6)
Hemoglobin: 14.3 g/dL (ref 11.1–15.9)
Immature Grans (Abs): 0 x10E3/uL (ref 0.0–0.1)
Immature Granulocytes: 0 %
Lymphocytes Absolute: 2.4 x10E3/uL (ref 0.7–3.1)
Lymphs: 37 %
MCH: 30.9 pg (ref 26.6–33.0)
MCHC: 32.7 g/dL (ref 31.5–35.7)
MCV: 94 fL (ref 79–97)
Monocytes Absolute: 0.3 x10E3/uL (ref 0.1–0.9)
Monocytes: 4 %
Neutrophils Absolute: 3.7 x10E3/uL (ref 1.4–7.0)
Neutrophils: 58 %
Platelets: 288 x10E3/uL (ref 150–450)
Pooled Donor- BAT CU: 4.7 % (ref 0.00–10.60)
RBC: 4.63 x10E6/uL (ref 3.77–5.28)
RDW: 12.7 % (ref 11.7–15.4)
Sed Rate: 23 mm/h (ref 0–32)
TSH: 0.646 u[IU]/mL (ref 0.450–4.500)
Thyroperoxidase Ab SerPl-aCnc: 15 [IU]/mL (ref 0–34)
WBC: 6.5 x10E3/uL (ref 3.4–10.8)

## 2023-12-31 LAB — ALPHA-GAL PANEL
Allergen Lamb IgE: 0.19 kU/L — AB
Beef IgE: 0.23 kU/L — AB
IgE (Immunoglobulin E), Serum: 294 [IU]/mL (ref 6–495)
O215-IgE Alpha-Gal: <0.1 kU/L
Pork IgE: <0.1 kU/L

## 2023-12-31 LAB — FANA STAINING PATTERNS: Speckled Pattern: 1:80 {titer}

## 2023-12-31 LAB — TRYPTASE: Tryptase: 3.5 ug/L (ref 2.2–13.2)

## 2024-01-02 ENCOUNTER — Encounter: Payer: Self-pay | Admitting: Radiology

## 2024-01-02 ENCOUNTER — Ambulatory Visit (INDEPENDENT_AMBULATORY_CARE_PROVIDER_SITE_OTHER): Payer: Self-pay | Admitting: Allergy

## 2024-01-02 ENCOUNTER — Ambulatory Visit: Payer: Self-pay | Admitting: Allergy

## 2024-01-02 ENCOUNTER — Encounter: Payer: Self-pay | Admitting: Allergy

## 2024-01-02 VITALS — BP 126/82 | HR 85 | Temp 98.1°F | Resp 20 | Wt 178.8 lb

## 2024-01-02 DIAGNOSIS — L508 Other urticaria: Secondary | ICD-10-CM

## 2024-01-02 NOTE — Progress Notes (Signed)
 I reviewed the bloodwork. Blood count, kidney function, liver function, electrolytes, thyroid, inflammation markers, chronic urticaria index (checks for autoantibodies that trigger mast cells), tryptase (checks for mast cell issues) were all normal which is great.   ANA was borderline positive 1:80 speckled pattern.  Alpha gal borderline positive to beef and lamb only.

## 2024-01-02 NOTE — Patient Instructions (Addendum)
 Hives: Start allegra (fexofenadine) 180mg  twice a day.  If symptoms are not controlled or causes drowsiness let us  know. Start Pepcid (famotidine) 20mg  twice a day.  Avoid the following potential triggers: alcohol, tight clothing, NSAIDs, hot showers and getting overheated. Continue proper skin care.  Avoid beef and lamb for now. If above regimen does NOT control symptoms then consider Xolair injections next - handout given.   Follow up in 6 weeks or sooner if needed.    Skin care recommendations  Bath time: Always use lukewarm water. AVOID very hot or cold water. Keep bathing time to 5-10 minutes. Do NOT use bubble bath. Use a mild soap and use just enough to wash the dirty areas. Do NOT scrub skin vigorously.  After bathing, pat dry your skin with a towel. Do NOT rub or scrub the skin.  Moisturizers and prescriptions:  ALWAYS apply moisturizers immediately after bathing (within 3 minutes). This helps to lock-in moisture. Use the moisturizer several times a day over the whole body. Good summer moisturizers include: Aveeno, CeraVe, Cetaphil. Good winter moisturizers include: Aquaphor, Vaseline, Cerave, Cetaphil, Eucerin, Vanicream. When using moisturizers along with medications, the moisturizer should be applied about one hour after applying the medication to prevent diluting effect of the medication or moisturize around where you applied the medications. When not using medications, the moisturizer can be continued twice daily as maintenance.  Laundry and clothing: Avoid laundry products with added color or perfumes. Use unscented hypo-allergenic laundry products such as Tide free, Cheer free & gentle, and All free and clear.  If the skin still seems dry or sensitive, you can try double-rinsing the clothes. Avoid tight or scratchy clothing such as wool. Do not use fabric softeners or dyer sheets.

## 2024-01-02 NOTE — Progress Notes (Signed)
 Follow Up Note  RE: Jasmine Burch MRN: [id number] DOB: Apr 12, 1975 Date of Office Visit: 01/02/2024  Referring provider: RAYNALDO DECK PUBLI* Primary care provider: [facility]  Chief Complaint: Follow-up (Follow up hives still on extremities and trunk, no treatment right now. Lab results)  History of Present Illness: I had the pleasure of seeing Jasmine Burch for a follow up visit at the [facility] on 01/02/2024. She is a 48 y.o. female, who is being followed for urticaria. Her previous allergy office visit was on 11/21/2023 with Dr. Luke. Today is a regular follow up visit.  Discussed the use of AI scribe software for clinical note transcription with the patient, who gave verbal consent to proceed.    She has been experiencing daily hives and rash since her last visit in August, primarily on her back and occasionally on her legs. She uses Zyrtec intermittently, taking one tablet during breakouts, which causes fatigue. She has not started Pepcid (famotidine).   She had joint pain and swelling in the past, particularly in her elbow, which was previously treated with steroids. X-rays showed no abnormalities. She has a history of visiting a rheumatologist in 2019-2020 without a specific diagnosis. Recent blood work showed a borderline ANA level.  In terms of diet, she consumes beef twice a week and rarely eats lamb. She is uncertain if her diet affects her symptoms. She describes her current day as 'average,' with symptoms fluctuating. No significant changes in her condition since the last visit.      2025 labs: Blood count, kidney function, liver function, electrolytes, thyroid, inflammation markers, chronic urticaria index (checks for autoantibodies that trigger mast cells), tryptase (checks for mast cell issues) were all normal which is great.   ANA was borderline positive 1:80 speckled pattern.  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  Assessment and Plan: Hara is a 48 y.o. female with: Chronic urticaria Past history - Breaking out at least twice per week for the past 2 months with no known triggers. Resolves within 1 hour. Taking Benadryl but it makes her drowsy.  Interim history - 2025 labs unremarkable except borderline positive to beef/lamb and ANA 1:80 speckled pattern. Still breaking out daily but has NOT taken medications (zyrtec + famotidine) as recommended at the last visit. Zyrtec makes her tired. 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 Discussed with patient, that urticaria is usually caused by release of histamine by cutaneous mast cells but sometimes it is non-histamine mediated. Explained that urticaria is not always associated with allergies. In most cases, the exact etiology for urticaria can not be established and it is considered idiopathic. Start allegra (fexofenadine) 180mg  twice a day.  If symptoms are not controlled or causes drowsiness let us  know. Start Pepcid (famotidine) 20mg  twice a day.  Avoid the following potential triggers: alcohol, tight clothing, NSAIDs, hot showers and getting overheated. Continue proper skin care.  Avoid beef and lamb for now. If above regimen does NOT control symptoms then consider Xolair injections next - handout given.  Discussed rheumatology referral due to elbow swelling in the past but apparently saw them in the past.    Return in about 6 weeks (around 02/13/2024).  No orders of the defined types were placed in this encounter.  Lab Orders  No laboratory test(s) ordered today    Diagnostics: None.   Medication List:  Current Outpatient Medications  Medication Sig Dispense Refill   ibuprofen  (ADVIL ,MOTRIN ) 600  MG tablet Take 1 tablet (600 mg total) by mouth every 8 (eight) hours as needed for moderate pain. 21 tablet 0   No current facility-administered medications for this visit.   Allergies: No Known  Allergies I reviewed her past medical history, social history, family history, and environmental history and no significant changes have been reported from her previous visit.  Review of Systems  Constitutional:  Negative for appetite change, chills, fever and unexpected weight change.  HENT:  Negative for congestion and rhinorrhea.   Eyes:  Negative for itching.       Dry eyes  Respiratory:  Negative for cough, chest tightness, shortness of breath and wheezing.   Cardiovascular:  Negative for chest pain.  Gastrointestinal:  Negative for abdominal pain.  Genitourinary:  Negative for difficulty urinating.  Skin:  Positive for rash.  Neurological:  Negative for headaches.    Objective: BP 126/82   Pulse 85   Temp 98.1 F (36.7 C) (Temporal)   Resp 20   Wt 178 lb 12.8 oz (81.1 kg)   SpO2 99%   BMI 32.08 kg/m  Body mass index is 32.08 kg/m. Physical Exam Vitals and nursing note reviewed.  Constitutional:      Appearance: Normal appearance. She is well-developed.  HENT:     Head: Normocephalic and atraumatic.     Right Ear: Tympanic membrane and external ear normal.     Left Ear: Tympanic membrane and external ear normal.     Nose: Nose normal.     Mouth/Throat:     Mouth: Mucous membranes are moist.     Pharynx: Oropharynx is clear.  Eyes:     Conjunctiva/sclera: Conjunctivae normal.  Cardiovascular:     Rate and Rhythm: Normal rate and regular rhythm.     Heart sounds: Normal heart sounds. No murmur heard.    No friction rub. No gallop.  Pulmonary:     Effort: Pulmonary effort is normal.     Breath sounds: Normal breath sounds. No wheezing, rhonchi or rales.  Musculoskeletal:     Cervical back: Neck supple.  Skin:    General: Skin is warm.     Findings: Rash present.     Comments: Scattered hives on abdominal area and upper extremities b/l.  Neurological:     Mental Status: She is alert and oriented to person, place, and time.  Psychiatric:        Behavior:  Behavior normal.    Previous notes and tests were reviewed. The plan was reviewed with the patient/family, and all questions/concerned were addressed.  It was my pleasure to see [name] today and participate in her care. Please feel free to contact me with any questions or concerns.  Sincerely,  Orlan Cramp, DO Allergy & Immunology  [facility]  [place] office: [phone] [facility] office: [phone]

## 2024-02-20 ENCOUNTER — Ambulatory Visit: Payer: Self-pay | Admitting: Allergy

## 2024-02-20 DIAGNOSIS — J309 Allergic rhinitis, unspecified: Secondary | ICD-10-CM

## 2024-02-20 NOTE — Progress Notes (Deleted)
 Follow Up Note  RE: Jasmine Burch MRN: [id number] DOB: March 31, 1976 Date of Office Visit: 02/20/2024  Referring provider: RAYNALDO JERELYN NORFOLK* Primary care provider: [facility]  Chief Complaint: No chief complaint on file.  History of Present Illness: I had the pleasure of seeing Jasmine Burch for a follow up visit at the [facility] on 02/20/2024. She is a 48 y.o. female, who is being followed for chronic urticaria. Her previous allergy office visit was on 01/02/2024 with Dr. Luke. Today is a regular follow up visit.  Discussed the use of AI scribe software for clinical note transcription with the patient, who gave verbal consent to proceed.  History of Present Illness            ***  Assessment and Plan: Jasmine Burch is a 48 y.o. female with: Chronic urticaria Past history - Breaking out at least twice per week for the past 2 months with no known triggers. Resolves within 1 hour. Taking Benadryl but it makes her drowsy.  Interim history - 2025 labs unremarkable except borderline positive to beef/lamb and ANA 1:80 speckled pattern. Still breaking out daily but has NOT taken medications (zyrtec + famotidine) as recommended at the last visit. Zyrtec makes her tired. 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 Discussed with patient, that urticaria is usually caused by release of histamine by cutaneous mast cells but sometimes it is non-histamine mediated. Explained that urticaria is not always associated with allergies. In most cases, the exact etiology for urticaria can not be established and it is considered idiopathic. Start allegra (fexofenadine) 180mg  twice a day.  If symptoms are not controlled or causes drowsiness let us  know. Start Pepcid (famotidine) 20mg  twice a day.  Avoid the following potential triggers: alcohol, tight clothing, NSAIDs, hot showers and getting overheated. Continue proper skin  care.  Avoid beef and lamb for now. If above regimen does NOT control symptoms then consider Xolair injections next - handout given.  Discussed rheumatology referral due to elbow swelling in the past but apparently saw them in the past.  Assessment and Plan              No follow-ups on file.  No orders of the defined types were placed in this encounter.  Lab Orders  No laboratory test(s) ordered today    Diagnostics: Spirometry:  Tracings reviewed. Her effort: {Blank single:19197::Good reproducible efforts.,It was hard to get consistent efforts and there is a question as to whether this reflects a maximal maneuver.,Poor effort, data can not be interpreted.} FVC: ***L FEV1: ***L, ***% predicted FEV1/FVC ratio: ***% Interpretation: {Blank single:19197::Spirometry consistent with mild obstructive disease,Spirometry consistent with moderate obstructive disease,Spirometry consistent with severe obstructive disease,Spirometry consistent with possible restrictive disease,Spirometry consistent with mixed obstructive and restrictive disease,Spirometry uninterpretable due to technique,Spirometry consistent with normal pattern,No overt abnormalities noted given today's efforts}.  Please see scanned spirometry results for details.  Skin Testing: {Blank single:19197::Select foods,Environmental allergy panel,Environmental allergy panel and select foods,Food allergy panel,None,Deferred due to recent antihistamines use}. *** Results discussed with patient/family.   Medication List:  Current Outpatient Medications  Medication Sig Dispense Refill   ibuprofen  (ADVIL ,MOTRIN ) 600 MG tablet Take 1 tablet (600 mg total) by mouth every 8 (eight) hours as needed for moderate pain. 21 tablet 0   No current facility-administered medications for this visit.   Allergies: No Known Allergies I reviewed her past medical history, social history, family history, and  environmental history and no significant  changes have been reported from her previous visit.  Review of Systems  Constitutional:  Negative for appetite change, chills, fever and unexpected weight change.  HENT:  Negative for congestion and rhinorrhea.   Eyes:  Negative for itching.       Dry eyes  Respiratory:  Negative for cough, chest tightness, shortness of breath and wheezing.   Cardiovascular:  Negative for chest pain.  Gastrointestinal:  Negative for abdominal pain.  Genitourinary:  Negative for difficulty urinating.  Skin:  Positive for rash.  Neurological:  Negative for headaches.    Objective: There were no vitals taken for this visit. There is no height or weight on file to calculate BMI. Physical Exam Vitals and nursing note reviewed.  Constitutional:      Appearance: Normal appearance. She is well-developed.  HENT:     Head: Normocephalic and atraumatic.     Right Ear: Tympanic membrane and external ear normal.     Left Ear: Tympanic membrane and external ear normal.     Nose: Nose normal.     Mouth/Throat:     Mouth: Mucous membranes are moist.     Pharynx: Oropharynx is clear.  Eyes:     Conjunctiva/sclera: Conjunctivae normal.  Cardiovascular:     Rate and Rhythm: Normal rate and regular rhythm.     Heart sounds: Normal heart sounds. No murmur heard.    No friction rub. No gallop.  Pulmonary:     Effort: Pulmonary effort is normal.     Breath sounds: Normal breath sounds. No wheezing, rhonchi or rales.  Musculoskeletal:     Cervical back: Neck supple.  Skin:    General: Skin is warm.     Findings: Rash present.     Comments: Scattered hives on abdominal area and upper extremities b/l.  Neurological:     Mental Status: She is alert and oriented to person, place, and time.  Psychiatric:        Behavior: Behavior normal.    Previous notes and tests were reviewed. The plan was reviewed with the patient/family, and all questions/concerned were  addressed.  It was my pleasure to see Jasmine Burch today and participate in her care. Please feel free to contact me with any questions or concerns.  Sincerely,  Orlan Cramp, DO Allergy & Immunology  [facility]  [facility] office: [phone] [facility] office: [phone]
